# Patient Record
Sex: Female | Born: 1955
Health system: Southern US, Community
[De-identification: ages and names within clinical notes are randomized; demographics above are authoritative.]

## PROBLEM LIST (undated history)

## (undated) DIAGNOSIS — G4733 Obstructive sleep apnea (adult) (pediatric): Secondary | ICD-10-CM

## (undated) DIAGNOSIS — E785 Hyperlipidemia, unspecified: Secondary | ICD-10-CM

## (undated) DIAGNOSIS — K743 Primary biliary cirrhosis: Secondary | ICD-10-CM

## (undated) DIAGNOSIS — I10 Essential (primary) hypertension: Secondary | ICD-10-CM

## (undated) DIAGNOSIS — E119 Type 2 diabetes mellitus without complications: Secondary | ICD-10-CM

---

## 2019-06-20 ENCOUNTER — Encounter (HOSPITAL_COMMUNITY): Payer: Self-pay

## 2019-06-20 ENCOUNTER — Emergency Department (HOSPITAL_COMMUNITY): Payer: Federal, State, Local not specified - PPO

## 2019-06-20 ENCOUNTER — Other Ambulatory Visit: Payer: Self-pay

## 2019-06-20 ENCOUNTER — Inpatient Hospital Stay (HOSPITAL_COMMUNITY)
Admission: EM | Admit: 2019-06-20 | Discharge: 2019-06-23 | DRG: 511 | Disposition: A | Discharge status: Home Health | Payer: Federal, State, Local not specified - PPO | Attending: Surgery | Admitting: Surgery

## 2019-06-20 DIAGNOSIS — Z20828 Contact with and (suspected) exposure to other viral communicable diseases: Secondary | ICD-10-CM | POA: Diagnosis present

## 2019-06-20 DIAGNOSIS — S2243XA Multiple fractures of ribs, bilateral, initial encounter for closed fracture: Secondary | ICD-10-CM | POA: Diagnosis present

## 2019-06-20 DIAGNOSIS — I10 Essential (primary) hypertension: Secondary | ICD-10-CM | POA: Diagnosis present

## 2019-06-20 DIAGNOSIS — S62109A Fracture of unspecified carpal bone, unspecified wrist, initial encounter for closed fracture: Secondary | ICD-10-CM

## 2019-06-20 DIAGNOSIS — Z7984 Long term (current) use of oral hypoglycemic drugs: Secondary | ICD-10-CM

## 2019-06-20 DIAGNOSIS — Z79899 Other long term (current) drug therapy: Secondary | ICD-10-CM

## 2019-06-20 DIAGNOSIS — S301XXA Contusion of abdominal wall, initial encounter: Secondary | ICD-10-CM | POA: Diagnosis present

## 2019-06-20 DIAGNOSIS — E785 Hyperlipidemia, unspecified: Secondary | ICD-10-CM | POA: Diagnosis present

## 2019-06-20 DIAGNOSIS — S63006A Unspecified dislocation of unspecified wrist and hand, initial encounter: Secondary | ICD-10-CM

## 2019-06-20 DIAGNOSIS — S52571A Other intraarticular fracture of lower end of right radius, initial encounter for closed fracture: Secondary | ICD-10-CM | POA: Diagnosis not present

## 2019-06-20 DIAGNOSIS — D62 Acute posthemorrhagic anemia: Secondary | ICD-10-CM | POA: Diagnosis not present

## 2019-06-20 DIAGNOSIS — S2249XA Multiple fractures of ribs, unspecified side, initial encounter for closed fracture: Secondary | ICD-10-CM

## 2019-06-20 DIAGNOSIS — G4733 Obstructive sleep apnea (adult) (pediatric): Secondary | ICD-10-CM | POA: Diagnosis present

## 2019-06-20 DIAGNOSIS — Z7982 Long term (current) use of aspirin: Secondary | ICD-10-CM

## 2019-06-20 DIAGNOSIS — Y92410 Unspecified street and highway as the place of occurrence of the external cause: Secondary | ICD-10-CM

## 2019-06-20 DIAGNOSIS — S52531A Colles' fracture of right radius, initial encounter for closed fracture: Secondary | ICD-10-CM

## 2019-06-20 DIAGNOSIS — S2241XA Multiple fractures of ribs, right side, initial encounter for closed fracture: Secondary | ICD-10-CM

## 2019-06-20 DIAGNOSIS — M25531 Pain in right wrist: Secondary | ICD-10-CM | POA: Diagnosis not present

## 2019-06-20 HISTORY — DX: Type 2 diabetes mellitus without complications: E11.9

## 2019-06-20 HISTORY — DX: Hyperlipidemia, unspecified: E78.5

## 2019-06-20 HISTORY — DX: Obstructive sleep apnea (adult) (pediatric): G47.33

## 2019-06-20 HISTORY — DX: Primary biliary cirrhosis: K74.3

## 2019-06-20 HISTORY — DX: Essential (primary) hypertension: I10

## 2019-06-20 LAB — I-STAT CHEM 8, ED
BUN: 20 mg/dL (ref 8–23)
Calcium, Ion: 1.07 mmol/L — ABNORMAL LOW (ref 1.15–1.40)
Chloride: 107 mmol/L (ref 98–111)
Creatinine, Ser: 0.6 mg/dL (ref 0.44–1.00)
Glucose, Bld: 246 mg/dL — ABNORMAL HIGH (ref 70–99)
HCT: 35 % — ABNORMAL LOW (ref 36.0–46.0)
Hemoglobin: 11.9 g/dL — ABNORMAL LOW (ref 12.0–15.0)
Potassium: 4.1 mmol/L (ref 3.5–5.1)
Sodium: 138 mmol/L (ref 135–145)
TCO2: 22 mmol/L (ref 22–32)

## 2019-06-20 LAB — CBC WITH DIFFERENTIAL/PLATELET
Abs Immature Granulocytes: 0.12 10*3/uL — ABNORMAL HIGH (ref 0.00–0.07)
Basophils Absolute: 0.1 10*3/uL (ref 0.0–0.1)
Basophils Relative: 0 %
Eosinophils Absolute: 0.1 10*3/uL (ref 0.0–0.5)
Eosinophils Relative: 0 %
HCT: 36.8 % (ref 36.0–46.0)
Hemoglobin: 11.9 g/dL — ABNORMAL LOW (ref 12.0–15.0)
Immature Granulocytes: 1 %
Lymphocytes Relative: 8 %
Lymphs Abs: 1.3 10*3/uL (ref 0.7–4.0)
MCH: 29.9 pg (ref 26.0–34.0)
MCHC: 32.3 g/dL (ref 30.0–36.0)
MCV: 92.5 fL (ref 80.0–100.0)
Monocytes Absolute: 1 10*3/uL (ref 0.1–1.0)
Monocytes Relative: 6 %
Neutro Abs: 13.6 10*3/uL — ABNORMAL HIGH (ref 1.7–7.7)
Neutrophils Relative %: 85 %
Platelets: 290 10*3/uL (ref 150–400)
RBC: 3.98 MIL/uL (ref 3.87–5.11)
RDW: 13.3 % (ref 11.5–15.5)
WBC: 16.2 10*3/uL — ABNORMAL HIGH (ref 4.0–10.5)
nRBC: 0 % (ref 0.0–0.2)

## 2019-06-20 LAB — BASIC METABOLIC PANEL
Anion gap: 12 (ref 5–15)
BUN: 18 mg/dL (ref 8–23)
CO2: 19 mmol/L — ABNORMAL LOW (ref 22–32)
Calcium: 9.2 mg/dL (ref 8.9–10.3)
Chloride: 106 mmol/L (ref 98–111)
Creatinine, Ser: 0.73 mg/dL (ref 0.44–1.00)
GFR calc Af Amer: >60 mL/min (ref >60)
GFR calc non Af Amer: >60 mL/min (ref >60)
Glucose, Bld: 241 mg/dL — ABNORMAL HIGH (ref 70–99)
Potassium: 4.1 mmol/L (ref 3.5–5.1)
Sodium: 137 mmol/L (ref 135–145)

## 2019-06-20 MED ORDER — MORPHINE SULFATE (PF) 4 MG/ML IV SOLN
4.0000 mg | Freq: Once | INTRAVENOUS | Status: AC
  Administered 2019-06-20: 4 mg via INTRAVENOUS
  Filled 2019-06-20: qty 1

## 2019-06-20 MED ORDER — IOHEXOL 300 MG/ML  SOLN
100.0000 mL | Freq: Once | INTRAMUSCULAR | Status: AC | PRN
  Administered 2019-06-20: 18:00:00 100 mL via INTRAVENOUS

## 2019-06-20 MED ORDER — HYDROMORPHONE HCL 1 MG/ML IJ SOLN
1.0000 mg | Freq: Once | INTRAMUSCULAR | Status: AC
  Administered 2019-06-20: 19:00:00 1 mg via INTRAVENOUS
  Filled 2019-06-20: qty 1

## 2019-06-20 MED ORDER — LIDOCAINE HCL 2 % IJ SOLN
20.0000 mL | Freq: Once | INTRAMUSCULAR | Status: AC
  Administered 2019-06-21: 400 mg
  Filled 2019-06-20: qty 20

## 2019-06-20 MED ORDER — HYDROMORPHONE HCL 1 MG/ML IJ SOLN
1.0000 mg | Freq: Once | INTRAMUSCULAR | Status: AC
  Administered 2019-06-20: 21:00:00 1 mg via INTRAVENOUS
  Filled 2019-06-20: qty 1
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
## 2019-06-21 ENCOUNTER — Encounter (HOSPITAL_COMMUNITY): Admission: EM | Disposition: A | Discharge status: Home Health | Payer: Self-pay | Source: Home / Self Care

## 2019-06-21 ENCOUNTER — Inpatient Hospital Stay (HOSPITAL_COMMUNITY): Payer: Federal, State, Local not specified - PPO | Admitting: Anesthesiology

## 2019-06-21 ENCOUNTER — Inpatient Hospital Stay (HOSPITAL_COMMUNITY): Payer: Federal, State, Local not specified - PPO

## 2019-06-21 DIAGNOSIS — D62 Acute posthemorrhagic anemia: Secondary | ICD-10-CM | POA: Diagnosis not present

## 2019-06-21 DIAGNOSIS — Z79899 Other long term (current) drug therapy: Secondary | ICD-10-CM | POA: Diagnosis not present

## 2019-06-21 DIAGNOSIS — Z7982 Long term (current) use of aspirin: Secondary | ICD-10-CM | POA: Diagnosis not present

## 2019-06-21 DIAGNOSIS — E785 Hyperlipidemia, unspecified: Secondary | ICD-10-CM | POA: Diagnosis present

## 2019-06-21 DIAGNOSIS — S2243XA Multiple fractures of ribs, bilateral, initial encounter for closed fracture: Secondary | ICD-10-CM | POA: Diagnosis present

## 2019-06-21 DIAGNOSIS — S2249XA Multiple fractures of ribs, unspecified side, initial encounter for closed fracture: Secondary | ICD-10-CM | POA: Diagnosis present

## 2019-06-21 DIAGNOSIS — S52571A Other intraarticular fracture of lower end of right radius, initial encounter for closed fracture: Secondary | ICD-10-CM

## 2019-06-21 DIAGNOSIS — S301XXA Contusion of abdominal wall, initial encounter: Secondary | ICD-10-CM | POA: Diagnosis present

## 2019-06-21 DIAGNOSIS — Z7984 Long term (current) use of oral hypoglycemic drugs: Secondary | ICD-10-CM | POA: Diagnosis not present

## 2019-06-21 DIAGNOSIS — M25531 Pain in right wrist: Secondary | ICD-10-CM

## 2019-06-21 DIAGNOSIS — G4733 Obstructive sleep apnea (adult) (pediatric): Secondary | ICD-10-CM | POA: Diagnosis present

## 2019-06-21 DIAGNOSIS — Z20828 Contact with and (suspected) exposure to other viral communicable diseases: Secondary | ICD-10-CM | POA: Diagnosis present

## 2019-06-21 DIAGNOSIS — Y92410 Unspecified street and highway as the place of occurrence of the external cause: Secondary | ICD-10-CM | POA: Diagnosis not present

## 2019-06-21 DIAGNOSIS — I10 Essential (primary) hypertension: Secondary | ICD-10-CM | POA: Diagnosis present

## 2019-06-21 HISTORY — PX: ORIF WRIST FRACTURE: SHX2133

## 2019-06-21 LAB — BASIC METABOLIC PANEL
Anion gap: 11 (ref 5–15)
BUN: 16 mg/dL (ref 8–23)
CO2: 24 mmol/L (ref 22–32)
Calcium: 8.9 mg/dL (ref 8.9–10.3)
Chloride: 104 mmol/L (ref 98–111)
Creatinine, Ser: 0.73 mg/dL (ref 0.44–1.00)
GFR calc Af Amer: >60 mL/min (ref >60)
GFR calc non Af Amer: >60 mL/min (ref >60)
Glucose, Bld: 222 mg/dL — ABNORMAL HIGH (ref 70–99)
Potassium: 4.2 mmol/L (ref 3.5–5.1)
Sodium: 139 mmol/L (ref 135–145)

## 2019-06-21 LAB — HIV ANTIBODY (ROUTINE TESTING W REFLEX): HIV Screen 4th Generation wRfx: NONREACTIVE

## 2019-06-21 LAB — HEMOGLOBIN A1C
Hgb A1c MFr Bld: 9 % — ABNORMAL HIGH (ref 4.8–5.6)
Mean Plasma Glucose: 211.6 mg/dL

## 2019-06-21 LAB — CBC
HCT: 32.7 % — ABNORMAL LOW (ref 36.0–46.0)
Hemoglobin: 10.9 g/dL — ABNORMAL LOW (ref 12.0–15.0)
MCH: 30.3 pg (ref 26.0–34.0)
MCHC: 33.3 g/dL (ref 30.0–36.0)
MCV: 90.8 fL (ref 80.0–100.0)
Platelets: 301 10*3/uL (ref 150–400)
RBC: 3.6 MIL/uL — ABNORMAL LOW (ref 3.87–5.11)
RDW: 13.5 % (ref 11.5–15.5)
WBC: 10.5 10*3/uL (ref 4.0–10.5)
nRBC: 0 % (ref 0.0–0.2)

## 2019-06-21 LAB — GLUCOSE, CAPILLARY
Glucose-Capillary: 195 mg/dL — ABNORMAL HIGH (ref 70–99)
Glucose-Capillary: 218 mg/dL — ABNORMAL HIGH (ref 70–99)
Glucose-Capillary: 158 mg/dL — ABNORMAL HIGH (ref 70–99)
Glucose-Capillary: 173 mg/dL — ABNORMAL HIGH (ref 70–99)
Glucose-Capillary: 213 mg/dL — ABNORMAL HIGH (ref 70–99)

## 2019-06-21 LAB — MRSA PCR SCREENING: MRSA by PCR: NEGATIVE

## 2019-06-21 LAB — SARS CORONAVIRUS 2 (TAT 6-24 HRS): SARS Coronavirus 2: NEGATIVE

## 2019-06-21 SURGERY — OPEN REDUCTION INTERNAL FIXATION (ORIF) WRIST FRACTURE
Anesthesia: Monitor Anesthesia Care | Site: Wrist | Laterality: Right

## 2019-06-21 MED ORDER — MEPERIDINE HCL 25 MG/ML IJ SOLN: 6.2500 mg | INTRAMUSCULAR | Status: DC | PRN

## 2019-06-21 MED ORDER — FENTANYL CITRATE (PF) 100 MCG/2ML IJ SOLN: 50.0000 ug | Freq: Once | INTRAMUSCULAR | Status: DC

## 2019-06-21 MED ORDER — ACETAMINOPHEN 325 MG PO TABS
650.0000 mg | ORAL_TABLET | ORAL | Status: DC | PRN
  Administered 2019-06-21: 05:00:00 650 mg via ORAL
  Filled 2019-06-21: qty 2

## 2019-06-21 MED ORDER — NITROGLYCERIN 0.4 MG SL SUBL: 0.4000 mg | SUBLINGUAL_TABLET | SUBLINGUAL | Status: DC | PRN

## 2019-06-21 MED ORDER — PROPOFOL 1000 MG/100ML IV EMUL
INTRAVENOUS | Status: AC
  Filled 2019-06-21: qty 200

## 2019-06-21 MED ORDER — OXYCODONE HCL 5 MG PO TABS
5.0000 mg | ORAL_TABLET | ORAL | Status: DC | PRN
  Filled 2019-06-21: qty 1

## 2019-06-21 MED ORDER — LACTATED RINGERS IV SOLN
INTRAVENOUS | Status: DC | PRN
  Administered 2019-06-21 (×2): via INTRAVENOUS

## 2019-06-21 MED ORDER — BUPIVACAINE-EPINEPHRINE (PF) 0.5% -1:200000 IJ SOLN
INTRAMUSCULAR | Status: DC | PRN
  Administered 2019-06-21: 30 mL via PERINEURAL

## 2019-06-21 MED ORDER — ONDANSETRON HCL 4 MG/2ML IJ SOLN
INTRAMUSCULAR | Status: AC
  Filled 2019-06-21: qty 2

## 2019-06-21 MED ORDER — LIDOCAINE-EPINEPHRINE 0.5 %-1:200000 IJ SOLN
INTRAMUSCULAR | Status: AC
  Filled 2019-06-21: qty 1

## 2019-06-21 MED ORDER — METFORMIN HCL 500 MG PO TABS
1000.0000 mg | ORAL_TABLET | Freq: Two times a day (BID) | ORAL | Status: DC
  Administered 2019-06-22 – 2019-06-23 (×3): 1000 mg via ORAL
  Filled 2019-06-21 (×3): qty 2

## 2019-06-21 MED ORDER — FENTANYL CITRATE (PF) 100 MCG/2ML IJ SOLN
INTRAMUSCULAR | Status: AC
  Filled 2019-06-21: qty 2

## 2019-06-21 MED ORDER — POTASSIUM CHLORIDE IN NACL 20-0.9 MEQ/L-% IV SOLN
INTRAVENOUS | Status: DC
  Administered 2019-06-21: 02:00:00 via INTRAVENOUS
  Filled 2019-06-21: qty 1000

## 2019-06-21 MED ORDER — 0.9 % SODIUM CHLORIDE (POUR BTL) OPTIME
TOPICAL | Status: DC | PRN
  Administered 2019-06-21: 1000 mL

## 2019-06-21 MED ORDER — ONDANSETRON HCL 4 MG/2ML IJ SOLN
4.0000 mg | Freq: Four times a day (QID) | INTRAMUSCULAR | Status: DC | PRN
  Administered 2019-06-21 – 2019-06-22 (×2): 4 mg via INTRAVENOUS
  Filled 2019-06-21 (×2): qty 2

## 2019-06-21 MED ORDER — METOPROLOL TARTRATE 5 MG/5ML IV SOLN: 5.0000 mg | Freq: Four times a day (QID) | INTRAVENOUS | Status: DC | PRN

## 2019-06-21 MED ORDER — ONDANSETRON HCL 4 MG/2ML IJ SOLN
4.0000 mg | Freq: Once | INTRAMUSCULAR | Status: AC
  Administered 2019-06-21: 01:00:00 4 mg via INTRAVENOUS

## 2019-06-21 MED ORDER — LISINOPRIL 5 MG PO TABS
2.5000 mg | ORAL_TABLET | Freq: Every day | ORAL | Status: DC
  Administered 2019-06-22 – 2019-06-23 (×2): 2.5 mg via ORAL
  Filled 2019-06-21 (×2): qty 1

## 2019-06-21 MED ORDER — PROPOFOL 10 MG/ML IV BOLUS
INTRAVENOUS | Status: AC
  Filled 2019-06-21: qty 20

## 2019-06-21 MED ORDER — MIDAZOLAM HCL 2 MG/2ML IJ SOLN
INTRAMUSCULAR | Status: AC
  Filled 2019-06-21: qty 2

## 2019-06-21 MED ORDER — ENOXAPARIN SODIUM 40 MG/0.4ML ~~LOC~~ SOLN
40.0000 mg | SUBCUTANEOUS | Status: DC
  Administered 2019-06-22 – 2019-06-23 (×2): 40 mg via SUBCUTANEOUS
  Filled 2019-06-21 (×2): qty 0.4

## 2019-06-21 MED ORDER — HYDROMORPHONE HCL 1 MG/ML IJ SOLN
1.0000 mg | INTRAMUSCULAR | Status: DC | PRN
  Administered 2019-06-21 – 2019-06-22 (×3): 1 mg via INTRAVENOUS
  Filled 2019-06-21 (×3): qty 1

## 2019-06-21 MED ORDER — PROPOFOL 500 MG/50ML IV EMUL
INTRAVENOUS | Status: DC | PRN
  Administered 2019-06-21: 50 ug/kg/min via INTRAVENOUS

## 2019-06-21 MED ORDER — LIDOCAINE-EPINEPHRINE 0.5 %-1:200000 IJ SOLN
INTRAMUSCULAR | Status: DC | PRN
  Administered 2019-06-21: 20 mL

## 2019-06-21 MED ORDER — FENTANYL CITRATE (PF) 250 MCG/5ML IJ SOLN
INTRAMUSCULAR | Status: AC
  Filled 2019-06-21: qty 5

## 2019-06-21 MED ORDER — OXYCODONE HCL 5 MG PO TABS
10.0000 mg | ORAL_TABLET | ORAL | Status: DC | PRN
  Administered 2019-06-21 – 2019-06-22 (×5): 10 mg via ORAL
  Filled 2019-06-21 (×4): qty 2

## 2019-06-21 MED ORDER — BUPIVACAINE HCL (PF) 0.25 % IJ SOLN
INTRAMUSCULAR | Status: AC
  Filled 2019-06-21: qty 30

## 2019-06-21 MED ORDER — PHENYLEPHRINE 40 MCG/ML (10ML) SYRINGE FOR IV PUSH (FOR BLOOD PRESSURE SUPPORT)
PREFILLED_SYRINGE | INTRAVENOUS | Status: AC
  Filled 2019-06-21: qty 10

## 2019-06-21 MED ORDER — METHOCARBAMOL 500 MG PO TABS
ORAL_TABLET | ORAL | Status: AC
  Filled 2019-06-21: qty 2

## 2019-06-21 MED ORDER — INSULIN ASPART 100 UNIT/ML ~~LOC~~ SOLN
0.0000 [IU] | Freq: Three times a day (TID) | SUBCUTANEOUS | Status: DC
  Administered 2019-06-21 (×2): 3 [IU] via SUBCUTANEOUS
  Administered 2019-06-21: 5 [IU] via SUBCUTANEOUS
  Administered 2019-06-22: 8 [IU] via SUBCUTANEOUS
  Administered 2019-06-22: 17:00:00 3 [IU] via SUBCUTANEOUS
  Administered 2019-06-22: 5 [IU] via SUBCUTANEOUS
  Administered 2019-06-23 (×2): 3 [IU] via SUBCUTANEOUS

## 2019-06-21 MED ORDER — ONDANSETRON HCL 4 MG/2ML IJ SOLN: 4.0000 mg | Freq: Once | INTRAMUSCULAR | Status: DC

## 2019-06-21 MED ORDER — ONDANSETRON 4 MG PO TBDP: 4.0000 mg | ORAL_TABLET | Freq: Four times a day (QID) | ORAL | Status: DC | PRN

## 2019-06-21 MED ORDER — DEXTROSE 5 % IV SOLN
3.0000 g | INTRAVENOUS | Status: AC
  Administered 2019-06-21: 3 g via INTRAVENOUS
  Filled 2019-06-21: qty 3000

## 2019-06-21 MED ORDER — ONDANSETRON HCL 4 MG/2ML IJ SOLN
INTRAMUSCULAR | Status: DC | PRN
  Administered 2019-06-21: 4 mg via INTRAVENOUS

## 2019-06-21 MED ORDER — METHOCARBAMOL 500 MG PO TABS
750.0000 mg | ORAL_TABLET | Freq: Three times a day (TID) | ORAL | Status: DC
  Administered 2019-06-21 – 2019-06-23 (×9): 750 mg via ORAL
  Filled 2019-06-21 (×8): qty 2

## 2019-06-21 MED ORDER — PROPOFOL 10 MG/ML IV BOLUS
125.0000 mg | Freq: Once | INTRAVENOUS | Status: AC
  Administered 2019-06-21: 02:00:00 60 mg via INTRAVENOUS
  Filled 2019-06-21: qty 20

## 2019-06-21 MED ORDER — KETOROLAC TROMETHAMINE 15 MG/ML IJ SOLN
15.0000 mg | Freq: Four times a day (QID) | INTRAMUSCULAR | Status: DC
  Administered 2019-06-21 – 2019-06-23 (×7): 15 mg via INTRAVENOUS
  Filled 2019-06-21 (×7): qty 1

## 2019-06-21 MED ORDER — LACTATED RINGERS IV SOLN
INTRAVENOUS | Status: DC
  Administered 2019-06-21 – 2019-06-22 (×2): via INTRAVENOUS

## 2019-06-21 MED ORDER — OXYCODONE HCL 5 MG PO TABS
ORAL_TABLET | ORAL | Status: AC
  Filled 2019-06-21: qty 2

## 2019-06-21 MED ORDER — ONDANSETRON HCL 4 MG/2ML IJ SOLN: 4.0000 mg | Freq: Once | INTRAMUSCULAR | Status: DC | PRN

## 2019-06-21 MED ORDER — HYDROMORPHONE HCL 1 MG/ML IJ SOLN
0.5000 mg | Freq: Once | INTRAMUSCULAR | Status: AC
  Administered 2019-06-21: 0.5 mg via INTRAVENOUS
  Filled 2019-06-21: qty 1

## 2019-06-21 MED ORDER — ESCITALOPRAM OXALATE 10 MG PO TABS
20.0000 mg | ORAL_TABLET | Freq: Every day | ORAL | Status: DC
  Administered 2019-06-21 – 2019-06-22 (×2): 20 mg via ORAL
  Filled 2019-06-21 (×2): qty 2

## 2019-06-21 MED ORDER — SIMVASTATIN 20 MG PO TABS
40.0000 mg | ORAL_TABLET | Freq: Every day | ORAL | Status: DC
  Administered 2019-06-22: 40 mg via ORAL
  Filled 2019-06-21: qty 2

## 2019-06-21 MED ORDER — FENTANYL CITRATE (PF) 100 MCG/2ML IJ SOLN
50.0000 ug | Freq: Once | INTRAMUSCULAR | Status: AC
  Administered 2019-06-21: 01:00:00 50 ug via INTRAVENOUS
  Filled 2019-06-21: qty 2

## 2019-06-21 MED ORDER — HYDROMORPHONE HCL 1 MG/ML IJ SOLN: 0.2500 mg | INTRAMUSCULAR | Status: DC | PRN

## 2019-06-21 MED ORDER — LINAGLIPTIN 5 MG PO TABS
5.0000 mg | ORAL_TABLET | Freq: Every day | ORAL | Status: DC
  Administered 2019-06-22 – 2019-06-23 (×2): 5 mg via ORAL
  Filled 2019-06-21 (×2): qty 1

## 2019-06-21 MED ORDER — GLIMEPIRIDE 2 MG PO TABS
2.0000 mg | ORAL_TABLET | Freq: Every day | ORAL | Status: DC
  Administered 2019-06-22 – 2019-06-23 (×2): 2 mg via ORAL
  Filled 2019-06-21 (×3): qty 1

## 2019-06-21 MED ORDER — PROPOFOL 10 MG/ML IV BOLUS
INTRAVENOUS | Status: DC | PRN
  Administered 2019-06-21: 10 mg via INTRAVENOUS

## 2019-06-21 MED ORDER — PROPOFOL 10 MG/ML IV BOLUS
INTRAVENOUS | Status: AC | PRN
  Administered 2019-06-21: 20 mg via INTRAVENOUS

## 2019-06-21 SURGICAL SUPPLY — 46 items
APL SKNCLS STERI-STRIP NONHPOA (GAUZE/BANDAGES/DRESSINGS) ×1
BENZOIN TINCTURE PRP APPL 2/3 (GAUZE/BANDAGES/DRESSINGS) ×1 IMPLANT
BIT DRILL 2.2 SS TIBIAL (BIT) ×1 IMPLANT
BNDG CMPR 9X4 STRL LF SNTH (GAUZE/BANDAGES/DRESSINGS) ×1
BNDG ELASTIC 3X5.8 VLCR STR LF (GAUZE/BANDAGES/DRESSINGS) ×2 IMPLANT
BNDG ELASTIC 4X5.8 VLCR STR LF (GAUZE/BANDAGES/DRESSINGS) ×2 IMPLANT
BNDG ESMARK 4X9 LF (GAUZE/BANDAGES/DRESSINGS) ×2 IMPLANT
BNDG GAUZE ELAST 4 BULKY (GAUZE/BANDAGES/DRESSINGS) ×1 IMPLANT
CANISTER SUCT 3000ML PPV (MISCELLANEOUS) ×2 IMPLANT
CORD BIPOLAR FORCEPS 12FT (ELECTRODE) ×2 IMPLANT
COVER SURGICAL LIGHT HANDLE (MISCELLANEOUS) ×2 IMPLANT
CUFF TOURN SGL QUICK 24 (TOURNIQUET CUFF) ×2
CUFF TRNQT CYL 24X4X16.5-23 (TOURNIQUET CUFF) IMPLANT
DRAPE OEC MINIVIEW 54X84 (DRAPES) ×2 IMPLANT
DRAPE SURG 17X23 STRL (DRAPES) ×2 IMPLANT
GAUZE SPONGE 4X4 12PLY STRL (GAUZE/BANDAGES/DRESSINGS) ×2 IMPLANT
GLOVE SS BIOGEL STRL SZ 8 (GLOVE) ×1 IMPLANT
GLOVE SUPERSENSE BIOGEL SZ 8 (GLOVE) ×1
GOWN STRL REUS W/ TWL LRG LVL3 (GOWN DISPOSABLE) ×1 IMPLANT
GOWN STRL REUS W/ TWL XL LVL3 (GOWN DISPOSABLE) ×1 IMPLANT
GOWN STRL REUS W/TWL LRG LVL3 (GOWN DISPOSABLE) ×2
GOWN STRL REUS W/TWL XL LVL3 (GOWN DISPOSABLE) ×2
K-WIRE 1.6 (WIRE) ×4
K-WIRE FX5X1.6XNS BN SS (WIRE) ×2
KIT BASIN OR (CUSTOM PROCEDURE TRAY) ×2 IMPLANT
KIT TURNOVER KIT B (KITS) ×2 IMPLANT
KWIRE FX5X1.6XNS BN SS (WIRE) IMPLANT
NEEDLE 22X1 1/2 (OR ONLY) (NEEDLE) ×1 IMPLANT
NS IRRIG 1000ML POUR BTL (IV SOLUTION) ×2 IMPLANT
PACK ORTHO EXTREMITY (CUSTOM PROCEDURE TRAY) ×2 IMPLANT
PAD ARMBOARD 7.5X6 YLW CONV (MISCELLANEOUS) ×4 IMPLANT
PEG LOCKING SMOOTH 2.2X18 (Peg) ×4 IMPLANT
PEG LOCKING SMOOTH 2.2X20 (Screw) ×3 IMPLANT
PLATE DVR CROSSLOCK STD RT (Plate) ×1 IMPLANT
SCREW LOCK 14X2.7X 3 LD TPR (Screw) IMPLANT
SCREW LOCKING 2.7X13MM (Screw) ×1 IMPLANT
SCREW LOCKING 2.7X14 (Screw) ×4 IMPLANT
STRIP CLOSURE SKIN 1/2X4 (GAUZE/BANDAGES/DRESSINGS) ×1 IMPLANT
SUT MNCRL AB 4-0 PS2 18 (SUTURE) ×2 IMPLANT
SYR CONTROL 10ML LL (SYRINGE) ×1 IMPLANT
TOWEL GREEN STERILE (TOWEL DISPOSABLE) ×2 IMPLANT
TOWEL GREEN STERILE FF (TOWEL DISPOSABLE) ×2 IMPLANT
TRAY FOLEY MTR SLVR 14FR STAT (SET/KITS/TRAYS/PACK) ×1 IMPLANT
TUBE CONNECTING 12X1/4 (SUCTIONS) ×2 IMPLANT
UNDERPAD 30X30 (UNDERPADS AND DIAPERS) ×2 IMPLANT
WATER STERILE IRR 1000ML POUR (IV SOLUTION) ×2 IMPLANT

## 2019-06-21 NOTE — Anesthesia Postprocedure Evaluation (Signed)
Anesthesia Post Note  Patient: Priscilla Mcfarland  Procedure(s) Performed: OPEN REDUCTION INTERNAL FIXATION DISTAL RADIUS FRACTURE (Right Wrist)     Patient location during evaluation: PACU Anesthesia Type: Regional Level of consciousness: awake and alert and patient cooperative Pain management: pain level controlled Vital Signs Assessment: post-procedure vital signs reviewed and stable Respiratory status: spontaneous breathing and respiratory function stable Cardiovascular status: stable Anesthetic complications: no    Last Vitals:  Vitals:   06/21/19 1610 06/21/19 1625  BP: (!) 142/62 140/66  Pulse: 77 77  Resp: 18 17  Temp:  36.8 C  SpO2: 96% 95%    Last Pain:  Vitals:   06/21/19 1654  TempSrc:   PainSc: 0-No pain                 Heather Streeper DAVID

## 2019-06-21 NOTE — Progress Notes (Signed)
PT Cancellation Note  Patient Details Name: Priscilla Mcfarland MRN: [id number] DOB: 05/24/1956   Cancelled Treatment:    Reason Eval/Treat Not Completed: Patient not medically ready. Planning to go to the OR today. PT will continue to f/u with pt acutely as available and appropriate.    Davis 06/21/2019, 12:30 PM

## 2019-06-21 NOTE — Progress Notes (Signed)
Orthopedic Tech Progress Note Patient Details:  Priscilla Mcfarland 11-25-1955 [id number]  Ortho Devices Type of Ortho Device: Sugartong splint, Finger trap Finger Trap Weight: 5lb Ortho Device/Splint Location: rue Ortho Device/Splint Interventions: Ordered, Application, Adjustment  applied 5 lbs figer traps for 25 min. Applied splint post reduction after finger traps. Post Interventions Patient Tolerated: Well Instructions Provided: Care of device, Adjustment of device   Karolee Stamps 06/21/2019, 2:06 AM

## 2019-06-21 NOTE — Anesthesia Procedure Notes (Signed)
Anesthesia Regional Block: Supraclavicular block   Pre-Anesthetic Checklist: ,, timeout performed, Correct Patient, Correct Site, Correct Laterality, Correct Procedure, Correct Position, site marked, Risks and benefits discussed,  Surgical consent,  Pre-op evaluation,  At surgeon's request and post-op pain management  Laterality: Right  Prep: chloraprep       Needles:   Needle Type: Echogenic Stimulator Needle     Needle Length: 9cm  Needle Gauge: 21     Additional Needles:   Procedures:, nerve stimulator,,,,,,,   Nerve Stimulator or Paresthesia:  Response: 0.4 mA,   Additional Responses:   Narrative:  Start time: 06/21/2019 1:10 PM End time: 06/21/2019 1:20 PM Injection made incrementally with aspirations every 5 mL.  Performed by: Personally  Anesthesiologist: Lillia Abed, MD  Additional Notes: Monitors applied. Patient sedated. Sterile prep and drape,hand hygiene and sterile gloves were used. Relevant anatomy identified.Needle position confirmed.Local anesthetic injected incrementally after negative aspiration. Local anesthetic spread visualized around nerve(s). Vascular puncture avoided. No complications. Image printed for medical record.The patient tolerated the procedure well.

## 2019-06-21 NOTE — Progress Notes (Signed)
Open reduction internal fixation of the right distal radius fracture performed today.  She tolerated the procedure well under regional anesthesia.  Patient can be discharged from my standpoint.  She will need follow-up with [facility] hand therapy for a removable volar wrist splint followed by follow-up with me in the office.  Please call me with any questions.
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
## 2019-06-21 NOTE — Transfer of Care (Signed)
Immediate Anesthesia Transfer of Care Note  Patient: BURMA KETCHER  Procedure(s) Performed: OPEN REDUCTION INTERNAL FIXATION DISTAL RADIUS FRACTURE (Right Wrist)  Patient Location: PACU  Anesthesia Type:MAC combined with regional for post-op pain  Level of Consciousness: awake, alert  and oriented  Airway & Oxygen Therapy: Patient Spontanous Breathing and Patient connected to nasal cannula oxygen  Post-op Assessment: Report given to RN and Post -op Vital signs reviewed and stable  Post vital signs: Reviewed and stable  Last Vitals:  Vitals Value Taken Time  BP 145/65 06/21/19 1525  Temp 36.8 C 06/21/19 1524  Pulse 84 06/21/19 1529  Resp 19 06/21/19 1529  SpO2 94 % 06/21/19 1529  Vitals shown include unvalidated device data.  Last Pain:  Vitals:   06/21/19 1524  TempSrc:   PainSc: 0-No pain      Patients Stated Pain Goal: 3 (34/28/76 8115)  Complications: No apparent anesthesia complications

## 2019-06-21 NOTE — Anesthesia Procedure Notes (Signed)
Procedure Name: MAC Date/Time: 06/21/2019 1:25 PM Performed by: Inda Coke, CRNA Pre-anesthesia Checklist: Patient identified, Emergency Drugs available, Suction available, Timeout performed and Patient being monitored Patient Re-evaluated:Patient Re-evaluated prior to induction Oxygen Delivery Method: Simple face mask Induction Type: IV induction Dental Injury: Teeth and Oropharynx as per pre-operative assessment
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
## 2019-06-21 NOTE — Interval H&P Note (Signed)
My history and physical from today was reviewed and I agree with the plan for open reduction internal fixation of distal radius fracture. Plan was discussed with the patient and she agrees. The risk and benefits were reviewed

## 2019-06-21 NOTE — Brief Op Note (Signed)
06/21/2019  3:19 PM  PATIENT:  Priscilla Mcfarland  63 y.o. female  PRE-OPERATIVE DIAGNOSIS:  Right Distal Radius Fracture  POST-OPERATIVE DIAGNOSIS:  Right Distal Radius Fracture  PROCEDURE:  Procedure(s): OPEN REDUCTION INTERNAL FIXATION DISTAL RADIUS FRACTURE (Right)  SURGEON:  Surgeon(s) and Role:    * Gregoire Bennis, Steffanie Dunn, MD - Primary  PHYSICIAN ASSISTANT: None   ASSISTANTS: none   ANESTHESIA:   regional  EBL:  10 mL   BLOOD ADMINISTERED:none  DRAINS: none   LOCAL MEDICATIONS USED:  LIDOCAINE   SPECIMEN:  No Specimen  DISPOSITION OF SPECIMEN:  N/A  COUNTS:  YES  TOURNIQUET:   Total Tourniquet Time Documented: Upper Arm (Right) - 59 minutes Total: Upper Arm (Right) - 59 minutes   DICTATION: .Viviann Spare Dictation  PLAN OF CARE: Admit to inpatient   PATIENT DISPOSITION:  PACU - hemodynamically stable.   Delay start of Pharmacological VTE agent (>24hrs) due to surgical blood loss or risk of bleeding: not applicable
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
## 2019-06-21 NOTE — Plan of Care (Signed)
  Problem: Education: Goal: Knowledge of General Education information will improve Description: Including pain rating scale, medication(s)/side effects and non-pharmacologic comfort measures Outcome: Progressing   Problem: Safety: Goal: Ability to remain free from injury will improve Outcome: Progressing   
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
## 2019-06-21 NOTE — Progress Notes (Signed)
OT Cancellation Note  Patient Details Name: Priscilla Mcfarland MRN: [id number] DOB: 03/05/1956   Cancelled Treatment:    Reason Eval/Treat Not Completed: Patient not medically ready;Other (comment). Plan is for surgery today pending Covid test results  Britt Bottom 06/21/2019, 10:23 AM

## 2019-06-21 NOTE — Progress Notes (Signed)
Orthopedic Tech Progress Note Patient Details:  Priscilla Mcfarland 26-Jul-1955 [id number]  Ortho Devices Type of Ortho Device: Sugartong splint Finger Trap Weight: 5lb Ortho Device/Splint Location: rue Ortho Device/Splint Interventions: Ordered, Application, Adjustment  applied post 2nd reduction. Post Interventions Patient Tolerated: Well Instructions Provided: Care of device, Adjustment of device   Karolee Stamps 06/21/2019, 2:08 AM

## 2019-06-22 ENCOUNTER — Inpatient Hospital Stay (HOSPITAL_COMMUNITY): Payer: Federal, State, Local not specified - PPO

## 2019-06-22 LAB — BASIC METABOLIC PANEL
Anion gap: 11 (ref 5–15)
BUN: 13 mg/dL (ref 8–23)
CO2: 23 mmol/L (ref 22–32)
Calcium: 8.7 mg/dL — ABNORMAL LOW (ref 8.9–10.3)
Chloride: 104 mmol/L (ref 98–111)
Creatinine, Ser: 0.78 mg/dL (ref 0.44–1.00)
GFR calc Af Amer: >60 mL/min (ref >60)
GFR calc non Af Amer: >60 mL/min (ref >60)
Glucose, Bld: 207 mg/dL — ABNORMAL HIGH (ref 70–99)
Potassium: 4.7 mmol/L (ref 3.5–5.1)
Sodium: 138 mmol/L (ref 135–145)

## 2019-06-22 LAB — CBC
HCT: 32.4 % — ABNORMAL LOW (ref 36.0–46.0)
Hemoglobin: 10.5 g/dL — ABNORMAL LOW (ref 12.0–15.0)
MCH: 30.3 pg (ref 26.0–34.0)
MCHC: 32.4 g/dL (ref 30.0–36.0)
MCV: 93.6 fL (ref 80.0–100.0)
Platelets: 273 10*3/uL (ref 150–400)
RBC: 3.46 MIL/uL — ABNORMAL LOW (ref 3.87–5.11)
RDW: 13.6 % (ref 11.5–15.5)
WBC: 11.5 10*3/uL — ABNORMAL HIGH (ref 4.0–10.5)
nRBC: 0 % (ref 0.0–0.2)

## 2019-06-22 LAB — GLUCOSE, CAPILLARY
Glucose-Capillary: 234 mg/dL — ABNORMAL HIGH (ref 70–99)
Glucose-Capillary: 284 mg/dL — ABNORMAL HIGH (ref 70–99)
Glucose-Capillary: 174 mg/dL — ABNORMAL HIGH (ref 70–99)
Glucose-Capillary: 124 mg/dL — ABNORMAL HIGH (ref 70–99)

## 2019-06-22 MED ORDER — CHLORHEXIDINE GLUCONATE CLOTH 2 % EX PADS: 6.0000 | MEDICATED_PAD | Freq: Every day | CUTANEOUS | Status: DC
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
## 2019-06-22 NOTE — Plan of Care (Signed)
  Problem: Pain Managment: Goal: General experience of comfort will improve Outcome: Progressing   Problem: Safety: Goal: Ability to remain free from injury will improve Outcome: Progressing   Problem: Skin Integrity: Goal: Risk for impaired skin integrity will decrease Outcome: Progressing   
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
## 2019-06-22 NOTE — Plan of Care (Signed)
PNT IS REQUIRING ASSIST WITH ADLS AND MOBILITY CURRENTLY. PNT WILL NEED 24/7 ASSIST AT HOME CURRENTLY. IF PNT HUSBAND IS ABLE TO ASSIST THEN HHOT RECOMMENDED.

## 2019-06-23 ENCOUNTER — Encounter: Payer: Self-pay | Admitting: *Deleted

## 2019-06-23 LAB — CBC
HCT: 29.1 % — ABNORMAL LOW (ref 36.0–46.0)
Hemoglobin: 9.3 g/dL — ABNORMAL LOW (ref 12.0–15.0)
MCH: 29.9 pg (ref 26.0–34.0)
MCHC: 32 g/dL (ref 30.0–36.0)
MCV: 93.6 fL (ref 80.0–100.0)
Platelets: 244 10*3/uL (ref 150–400)
RBC: 3.11 MIL/uL — ABNORMAL LOW (ref 3.87–5.11)
RDW: 13.6 % (ref 11.5–15.5)
WBC: 11.1 10*3/uL — ABNORMAL HIGH (ref 4.0–10.5)
nRBC: 0 % (ref 0.0–0.2)

## 2019-06-23 LAB — BASIC METABOLIC PANEL
Anion gap: 11 (ref 5–15)
BUN: 24 mg/dL — ABNORMAL HIGH (ref 8–23)
CO2: 18 mmol/L — ABNORMAL LOW (ref 22–32)
Calcium: 8.2 mg/dL — ABNORMAL LOW (ref 8.9–10.3)
Chloride: 107 mmol/L (ref 98–111)
Creatinine, Ser: 1.12 mg/dL — ABNORMAL HIGH (ref 0.44–1.00)
GFR calc Af Amer: >60 mL/min (ref >60)
GFR calc non Af Amer: 52 mL/min — ABNORMAL LOW (ref >60)
Glucose, Bld: 175 mg/dL — ABNORMAL HIGH (ref 70–99)
Potassium: 4.5 mmol/L (ref 3.5–5.1)
Sodium: 136 mmol/L (ref 135–145)

## 2019-06-23 LAB — GLUCOSE, CAPILLARY
Glucose-Capillary: 157 mg/dL — ABNORMAL HIGH (ref 70–99)
Glucose-Capillary: 186 mg/dL — ABNORMAL HIGH (ref 70–99)
Glucose-Capillary: 139 mg/dL — ABNORMAL HIGH (ref 70–99)

## 2019-06-23 LAB — PHOSPHORUS: Phosphorus: 3.5 mg/dL (ref 2.5–4.6)

## 2019-06-23 LAB — MAGNESIUM: Magnesium: 2.2 mg/dL (ref 1.7–2.4)

## 2019-06-23 MED ORDER — ACETAMINOPHEN 325 MG PO TABS
650.0000 mg | ORAL_TABLET | Freq: Four times a day (QID) | ORAL | Status: DC
  Administered 2019-06-23: 09:00:00 650 mg via ORAL
  Filled 2019-06-23: qty 2

## 2019-06-23 MED ORDER — POLYETHYLENE GLYCOL 3350 17 G PO PACK
17.0000 g | PACK | Freq: Every day | ORAL | Status: DC
  Administered 2019-06-23: 09:00:00 17 g via ORAL
  Filled 2019-06-23: qty 1

## 2019-06-23 MED ORDER — DOCUSATE SODIUM 100 MG PO CAPS
100.0000 mg | ORAL_CAPSULE | Freq: Two times a day (BID) | ORAL | Status: DC
  Administered 2019-06-23: 09:00:00 100 mg via ORAL
  Filled 2019-06-23: qty 1

## 2019-06-23 MED ORDER — DOCUSATE SODIUM 100 MG PO CAPS: 100.0000 mg | ORAL_CAPSULE | Freq: Two times a day (BID) | ORAL | 0 refills | Status: AC

## 2019-06-23 MED ORDER — ACETAMINOPHEN 325 MG PO TABS: 650.0000 mg | ORAL_TABLET | Freq: Four times a day (QID) | ORAL | Status: DC | PRN

## 2019-06-23 MED ORDER — OXYCODONE HCL 5 MG PO TABS: 5.0000 mg | ORAL_TABLET | Freq: Four times a day (QID) | ORAL | 0 refills | Status: AC | PRN

## 2019-06-23 MED ORDER — OXYCODONE HCL 5 MG PO TABS
5.0000 mg | ORAL_TABLET | ORAL | Status: DC | PRN
  Administered 2019-06-23 (×2): 10 mg via ORAL
  Filled 2019-06-23 (×2): qty 2

## 2019-06-23 MED ORDER — METHOCARBAMOL 750 MG PO TABS: 750.0000 mg | ORAL_TABLET | Freq: Three times a day (TID) | ORAL | 0 refills | Status: AC | PRN

## 2019-06-23 MED FILL — METHOCARBAMOL 750 MG TABS: 750 | 20 days supply | Qty: 60 | Fill #0

## 2019-06-23 MED FILL — oxyCODONE HCL 5 MG TABS: 5 | 4 days supply | Qty: 15 | Fill #0

## 2019-06-23 NOTE — Discharge Instructions (Signed)
RIB FRACTURES  HOME INSTRUCTIONS   1. PAIN CONTROL:  1. Pain is best controlled by a usual combination of three different methods TOGETHER:  i. Ice/Heat ii. Over the counter pain medication iii. Prescription pain medication 2. You may experience some swelling and bruising in area of broken ribs. Ice packs or heating pads (30-60 minutes up to 6 times a day) will help. Use ice for the first few days to help decrease swelling and bruising, then switch to heat to help relax tight/sore spots and speed recovery. Some people prefer to use ice alone, heat alone, alternating between ice & heat. Experiment to what works for you. Swelling and bruising can take several weeks to resolve.  3. It is helpful to take an over-the-counter pain medication regularly for the first few weeks. Choose one of the following that works best for you:  i. Naproxen (Aleve, etc) Two 220mg  tabs twice a day ii. Ibuprofen (Advil, etc) Three 200mg  tabs four times a day (every meal & bedtime) iii. Acetaminophen (Tylenol, etc) 500-650mg  four times a day (every meal & bedtime) 4. A prescription for pain medication (such as oxycodone, hydrocodone, etc) may be given to you upon discharge. Take your pain medication as prescribed.  i. If you are having problems/concerns with the prescription medicine (does not control pain, nausea, vomiting, rash, itching, etc), please call us [phone] to see if we need to switch you to a different pain medicine that will work better for you and/or control your side effect better. ii. If you need a refill on your pain medication, please contact your pharmacy. They will contact our office to request authorization. Prescriptions will not be filled after 5 pm or on week-ends. 1. Avoid getting constipated. When taking pain medications, it is common to experience some constipation. Increasing fluid intake and taking a fiber supplement (such as Metamucil, Citrucel, FiberCon, MiraLax, etc) 1-2 times a day  regularly will usually help prevent this problem from occurring. A mild laxative (prune juice, Milk of Magnesia, MiraLax, etc) should be taken according to package directions if there are no bowel movements after 48 hours.  2. Watch out for diarrhea. If you have many loose bowel movements, simplify your diet to bland foods & liquids for a few days. Stop any stool softeners and decrease your fiber supplement. Switching to mild anti-diarrheal medications (Kayopectate, Pepto Bismol) can help. If this worsens or does not improve, please call us. 3. FOLLOW UP  a. If a follow up appointment is needed one will be scheduled for you. If none is needed with our trauma team, please follow up with your primary care provider within 2-3 weeks from discharge. Please call [facility] at [phone] if you have any questions about follow up.  b. If you have any orthopedic or other injuries you will need to follow up as outlined in your follow up instructions.   WHEN TO CALL us [phone]:  1. Poor pain control 2. Reactions / problems with new medications (rash/itching, nausea, etc)  3. Fever over 101.5 F (38.5 C) 4. Worsening swelling or bruising 5. Worsening pain, productive cough, difficulty breathing or any other concerning symptoms  The clinic staff is available to answer your questions during regular business hours (8:30am-5pm). Please don't hesitate to call and ask to speak to one of our nurses for clinical concerns.  If you have a medical emergency, go to the nearest emergency room or call 911.  A surgeon from [facility] is always on call  at the hospitals   [facility]  [address], [address], [place],  [address] ?  MAIN: [phone] ? TOLL FREE: [phone] ?  FAX [phone]  [email or web address]      Information on Rib Fractures  A rib fracture is a break or crack in one of the bones of the ribs. The ribs are long, curved bones that  wrap around your chest and attach to your spine and your breastbone. The ribs protect your heart, lungs, and other organs in the chest. A broken or cracked rib is often painful but is not usually serious. Most rib fractures heal on their own over time. However, rib fractures can be more serious if multiple ribs are broken or if broken ribs move out of place and push against other structures or organs. What are the causes? This condition is caused by:  Repetitive movements with high force, such as pitching a baseball or having severe coughing spells.  A direct blow to the chest, such as a sports injury, a car accident, or a fall.  Cancer that has spread to the bones, which can weaken bones and cause them to break. What are the signs or symptoms? Symptoms of this condition include:  Pain when you breathe in or cough.  Pain when someone presses on the injured area.  Feeling short of breath. How is this diagnosed? This condition is diagnosed with a physical exam and medical history. Imaging tests may also be done, such as:  Chest X-ray.  CT scan.  MRI.  Bone scan.  Chest ultrasound. How is this treated? Treatment for this condition depends on the severity of the fracture. Most rib fractures usually heal on their own in 1-3 months. Sometimes healing takes longer if there is a cough that does not stop or if there are other activities that make the injury worse (aggravating factors). While you heal, you will be given medicines to control the pain. You will also be taught deep breathing exercises. Severe injuries may require hospitalization or surgery. Follow these instructions at home: Managing pain, stiffness, and swelling  If directed, apply ice to the injured area. ? Put ice in a plastic bag. ? Place a towel between your skin and the bag. ? Leave the ice on for 20 minutes, 2-3 times a day.  Take over-the-counter and prescription medicines only as told by your health care  provider. Activity  Avoid a lot of activity and any activities or movements that cause pain. Be careful during activities and avoid bumping the injured rib.  Slowly increase your activity as told by your health care provider. General instructions  Do deep breathing exercises as told by your health care provider. This helps prevent pneumonia, which is a common complication of a broken rib. Your health care provider may instruct you to: ? Take deep breaths several times a day. ? Try to cough several times a day, holding a pillow against the injured area. ? Use a device called incentive spirometer to practice deep breathing several times a day.  Drink enough fluid to keep your urine pale yellow.  Do not wear a rib belt or binder. These restrict breathing, which can lead to pneumonia.  Keep all follow-up visits as told by your health care provider. This is important. Contact a health care provider if:  You have a fever. Get help right away if:  You have difficulty breathing or you are short of breath.  You develop a cough that does   not stop, or you cough up thick or bloody sputum.  You have nausea, vomiting, or pain in your abdomen.  Your pain gets worse and medicine does not help. Summary  A rib fracture is a break or crack in one of the bones of the ribs.  A broken or cracked rib is often painful but is not usually serious.  Most rib fractures heal on their own over time.  Treatment for this condition depends on the severity of the fracture.  Avoid a lot of activity and any activities or movements that cause pain. This information is not intended to replace advice given to you by your health care provider. Make sure you discuss any questions you have with your health care provider. Document Released: 06/26/2005 Document Revised: 09/25/2016 Document Reviewed: 09/25/2016 Elsevier Interactive Patient Education  2019 [place] or Splint Care, Adult Casts and  splints are supports that are worn to protect broken bones and other injuries. A cast or splint may hold a bone still and in the correct position while it heals. Casts and splints may also help ease pain, swelling, and muscle spasms. A cast is a hardened support that is usually made of fiberglass or plaster. It is custom-fit to the body and it offers more protection than a splint. It cannot be taken off and put back on. A splint is a type of soft support that is usually made from cloth and elastic. It can be adjusted or taken off as needed. You may need a cast or a splint if you:  Have a broken bone.  Have a soft-tissue injury.  Need to keep an injured body part from moving (keep it immobile) after surgery. How is this treated? If you have a cast:   Do not stick anything inside the cast to scratch your skin. Sticking something in the cast increases your risk of infection.  Check the skin around the cast every day. Tell your health care provider about any concerns.  You may put lotion on dry skin around the edges of the cast. Do not put lotion on the skin underneath the cast.  Keep the cast clean.  If the cast is not waterproof: ? Do not let it get wet. ? Cover it with a watertight covering when you take a bath or a shower. If you have a splint:   Wear it as told by your health care provider. Remove it only as told by your health care provider.  Loosen the splint if your fingers or toes tingle, become numb, or turn cold and blue.  Keep the splint clean.  If the splint is not waterproof: ? Do not let it get wet. ? Cover it with a watertight covering when you take a bath or a shower. Bathing  Do not take baths or swim until your health care provider approves. Ask your health care provider if you can take showers. You may only be allowed to take sponge baths for bathing.  If your cast or splint is not waterproof, cover it with a watertight covering when you take a bath or  shower. Managing pain, stiffness, and swelling  Move your fingers or toes often to avoid stiffness and to lessen swelling.  Raise (elevate) the injured area above the level of your heart while sitting or lying down. Safety  Do not use the injured limb to support your body weight until your health care provider says that it is okay.  Use crutches or other assistive  devices as told by your health care provider. General instructions  Do not put pressure on any part of the cast or splint until it is fully hardened. This may take several hours.  Return to your normal activities as told by your health care provider. Ask your health care provider what activities are safe for you.  Take over-the-counter and prescription medicines only as told by your health care provider.  Keep all follow-up visits as told by your health care provider. This is important. Contact a health care provider if:  Your cast or splint gets damaged.  The skin around the cast gets red or raw.  The skin under the cast is extremely itchy or painful.  Your cast or splint feels very uncomfortable.  Your cast or splint is too tight or too loose.  Your cast becomes wet or it develops a soft spot or area.  You get an object stuck under your cast. Get help right away if:  Your pain is getting worse.  The injured area tingles, becomes numb, or turns cold and blue.  The part of your body above or below the cast is swollen and discolored.  You cannot feel or move your fingers or toes.  There is fluid leaking through the cast.  You have severe pain or pressure under the cast.  You have trouble breathing.  You have shortness of breath.  You have chest pain. This information is not intended to replace advice given to you by your health care provider. Make sure you discuss any questions you have with your health care provider. Document Released: 06/23/2000 Document Revised: 04/23/2017 Document Reviewed:  12/18/2015 Elsevier Patient Education  2020 [employer].

## 2019-06-23 NOTE — Progress Notes (Signed)
Patient discharging home. Discharge instructions explained to patient and she verbalized understanding. Packed all her belongings. Meds delivered to room by Pharmacy. Awaiting for her walker. No further questions or concerns voiced.

## 2019-06-23 NOTE — Plan of Care (Signed)
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
## 2019-06-23 NOTE — Plan of Care (Signed)
  Problem: Pain Managment: Goal: General experience of comfort will improve Outcome: Progressing   Problem: Safety: Goal: Ability to remain free from injury will improve Outcome: Progressing   Problem: Skin Integrity: Goal: Risk for impaired skin integrity will decrease Outcome: Progressing   
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
## 2019-06-26 ENCOUNTER — Institutional Professional Consult (permissible substitution): Payer: Federal, State, Local not specified - PPO | Admitting: Plastic Surgery

## 2019-07-01 ENCOUNTER — Telehealth: Payer: Self-pay | Admitting: Surgical

## 2019-07-01 DIAGNOSIS — S62101D Fracture of unspecified carpal bone, right wrist, subsequent encounter for fracture with routine healing: Secondary | ICD-10-CM

## 2019-07-01 NOTE — Telephone Encounter (Signed)
Called patient, did not answer. No VML.  Ordered right wrist complete x-ray

## 2019-07-02 ENCOUNTER — Other Ambulatory Visit: Payer: Self-pay

## 2019-07-02 ENCOUNTER — Ambulatory Visit (HOSPITAL_BASED_OUTPATIENT_CLINIC_OR_DEPARTMENT_OTHER)
Admission: RE | Admit: 2019-07-02 | Discharge: 2019-07-02 | Disposition: A | Discharge status: Home / Self Care | Payer: Federal, State, Local not specified - PPO | Source: Ambulatory Visit | Attending: Surgical | Admitting: Surgical

## 2019-07-02 DIAGNOSIS — S62101D Fracture of unspecified carpal bone, right wrist, subsequent encounter for fracture with routine healing: Secondary | ICD-10-CM

## 2019-07-09 ENCOUNTER — Other Ambulatory Visit: Payer: Self-pay

## 2019-07-09 ENCOUNTER — Ambulatory Visit (INDEPENDENT_AMBULATORY_CARE_PROVIDER_SITE_OTHER): Payer: Federal, State, Local not specified - PPO | Admitting: Plastic Surgery

## 2019-07-09 ENCOUNTER — Encounter: Payer: Self-pay | Admitting: Plastic Surgery

## 2019-07-09 VITALS — BP 131/74 | HR 67 | Temp 97.3°F | Ht 67.0 in | Wt 285.0 lb

## 2019-07-09 DIAGNOSIS — S62101D Fracture of unspecified carpal bone, right wrist, subsequent encounter for fracture with routine healing: Secondary | ICD-10-CM

## 2019-07-09 NOTE — Progress Notes (Signed)
Patient is here 2-1/2 weeks postop from an open reduction internal fixation of the right distal radius fracture.  She is overall doing well and feels like her pain is improving.  She is noticing gradual improvement in the range of motion in her fingers.  She reports a tingling sensation in the thumb and index finger.  On exam everything looks good her wound looks to be healing fine she has good flexion and extension of all of her fingers and her thumb.  There is a mild paresthesia in the thumb and index finger but she says this is getting better with time.  There is normal sensation in the long ring and small fingers.  Postop x-ray from within the last week shows excellent alignment of the fracture and hardware with no signs of any complication.  We will plan to send her for a removable splint and she should be able to start gentle range of motion of the wrist after another week or 2.  I will plan to see your in about 3 weeks with another x-ray.

## 2019-07-16 ENCOUNTER — Encounter: Payer: Federal, State, Local not specified - PPO | Admitting: *Deleted

## 2019-07-16 ENCOUNTER — Other Ambulatory Visit: Payer: Self-pay

## 2019-07-16 ENCOUNTER — Encounter: Payer: Self-pay | Admitting: *Deleted

## 2019-07-16 ENCOUNTER — Ambulatory Visit: Payer: No Typology Code available for payment source | Admitting: *Deleted

## 2019-07-16 ENCOUNTER — Ambulatory Visit: Payer: Federal, State, Local not specified - PPO | Attending: Plastic Surgery | Admitting: *Deleted

## 2019-07-16 DIAGNOSIS — R278 Other lack of coordination: Secondary | ICD-10-CM

## 2019-07-16 DIAGNOSIS — R29898 Other symptoms and signs involving the musculoskeletal system: Secondary | ICD-10-CM | POA: Insufficient documentation

## 2019-07-16 DIAGNOSIS — R601 Generalized edema: Secondary | ICD-10-CM | POA: Diagnosis present

## 2019-07-16 DIAGNOSIS — M79641 Pain in right hand: Secondary | ICD-10-CM | POA: Diagnosis present

## 2019-07-16 DIAGNOSIS — M25531 Pain in right wrist: Secondary | ICD-10-CM | POA: Diagnosis not present

## 2019-07-16 NOTE — Patient Instructions (Addendum)
WEARING SCHEDULE:  Wear splint at ALL times except for hygiene care (May remove splint for exercises and then immediately place back on ONLY if directed by the therapist)  PURPOSE:  To prevent movement and for protection until injury can heal  CARE OF SPLINT:  Keep splint away from heat sources including: stove, radiator or furnace, or a car in sunlight. The splint can melt and will no longer fit you properly  Keep away from pets and children  Clean the splint with rubbing alcohol as needed..  * During this time, make sure you also clean your hand/arm as instructed by your therapist and/or perform dressing changes as needed. Then dry hand/arm completely before replacing splint. (When cleaning hand/arm, keep it immobilized in same position until splint is replaced)  PRECAUTIONS/POTENTIAL PROBLEMS: *If you notice or experience increased pain, swelling, numbness, or a lingering reddened area from the splint: Contact your therapist immediately by calling [phone]. You must wear the splint for protection, but we will get you scheduled for adjustments as quickly as possible.  (If only straps or hooks need to be replaced and NO adjustments to the splint need to be made, just call the office ahead and let them know you are coming in)  If you have any medical concerns or signs of infection, please call your doctor immediately  Flexor Tendon Gliding (Active Full Fist)    With your splint on, Straighten all fingers, then make a fist, bending all joints. Repeat _5-10_ times. Do _4-6__ sessions per day.  Opposition (Active)    With your splint on, Touch tip of thumb to nail tip of each finger in turn, making an "O" shape. Repeat _5___ times. Do 4-6__ sessions per day.           On 07/26/2019 you can begin these exercises: Take the brace off to do these exercises: WRIST: Flexion Against Gravity    Rest arm and hand on surface, palm up. Raise wrist up. 5-10___ reps per set,  __4-6_ sets per day   WRIST: Extension Against Gravity    Rest arm and hand on surface, palm down. Raise wrist up. _5-10 reps per set, _4-6_ sets per day   Pronation (Active)    With elbow held at side, wrist straight and palm facing up, turn until palm faces down completely. Hold __5__ seconds. Repeat __5-10__ times. Do _4-6__ sessions per day. Activity: Use this motion to turn pages of a book.*  Supination (Active)    With elbow held at right angle and kept at side, turn palm upward as far as possible. Hold _5__ seconds. Repeat _5-10_ times. Do __4-6__ sessions per day. Activity: Use this motion when turning cards over.*  Radial Deviation (Active With Finger Flexion)    With fingers curled, bend wrist toward thumb side. Repeat __5__ times. Do __4-6__ sessions per day.  Ulnar Deviation (Active)    With fingers curled, bend hand to side at wrist in direction of little finger. Repeat __5-10__ times. Do __4-6__ sessions per day.  Copyright  VHI. All rights reserved.
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
## 2019-07-29 ENCOUNTER — Telehealth: Payer: Self-pay | Admitting: Plastic Surgery

## 2019-07-29 NOTE — Telephone Encounter (Signed)
Ibuprofen 2x a day will be fine. If she is in pain, she should take the ibuprofen, or tylenol. Whichever works best for her. If she does not have pain, she should not take any ibuprofen or tylenol. I tried to call patient this afternoon at 4:06pm but she did not answer.
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
## 2019-07-29 NOTE — Telephone Encounter (Signed)
Called and spoke with the patient and informed her of the message below from [facility].  Patient verbalized understanding and agreed.//AB/CMA

## 2019-07-30 ENCOUNTER — Telehealth: Payer: Self-pay

## 2019-07-30 NOTE — Telephone Encounter (Signed)
Patient can remove tape. Keep wrist splint in place per OT orders. Call with any questions or concerns.

## 2019-07-30 NOTE — Telephone Encounter (Signed)
Called and informed the patient of the responses from [facility] regarding the patient's message below.  Patient verbalized understanding and agreed.//AB/CMA

## 2019-07-30 NOTE — Telephone Encounter (Signed)
Patient had follow up with Dr. Arita Miss tomorrow, but is rescheduling due to fever, chills, sore throat, and body aches. She will be tested for Covid-19 tomorrow.   She still has surgical tape on her right wrist from surgery on 06/21/2019. She states the tape has dried blood on it as well as an odor. She would like to know if she is able to remove this herself or if she should leave it on.

## 2019-07-31 ENCOUNTER — Ambulatory Visit: Payer: Federal, State, Local not specified - PPO | Admitting: Plastic Surgery

## 2019-08-14 ENCOUNTER — Ambulatory Visit: Payer: Federal, State, Local not specified - PPO | Admitting: Plastic Surgery

## 2019-08-20 ENCOUNTER — Other Ambulatory Visit: Payer: Self-pay

## 2019-08-20 ENCOUNTER — Ambulatory Visit (HOSPITAL_BASED_OUTPATIENT_CLINIC_OR_DEPARTMENT_OTHER)
Admission: RE | Admit: 2019-08-20 | Discharge: 2019-08-20 | Disposition: A | Discharge status: Home / Self Care | Payer: Federal, State, Local not specified - PPO | Source: Ambulatory Visit | Attending: Plastic Surgery | Admitting: Plastic Surgery

## 2019-08-20 DIAGNOSIS — S62101D Fracture of unspecified carpal bone, right wrist, subsequent encounter for fracture with routine healing: Secondary | ICD-10-CM | POA: Diagnosis not present

## 2019-08-21 ENCOUNTER — Encounter: Payer: Self-pay | Admitting: Plastic Surgery

## 2019-08-21 ENCOUNTER — Ambulatory Visit (INDEPENDENT_AMBULATORY_CARE_PROVIDER_SITE_OTHER): Payer: Federal, State, Local not specified - PPO | Admitting: Plastic Surgery

## 2019-08-21 VITALS — BP 131/56 | HR 68 | Temp 97.1°F | Ht 67.0 in | Wt 279.0 lb

## 2019-08-21 DIAGNOSIS — S62101D Fracture of unspecified carpal bone, right wrist, subsequent encounter for fracture with routine healing: Secondary | ICD-10-CM

## 2019-08-21 NOTE — Progress Notes (Signed)
Patient is here postop from an open reduction internal fixation of right distal radius.  She feels like she is doing well.  She still has some stiffness in the wrist.  Therapy has been a long distance for her so she has been doing most of it at home.  She also reports some slight decreased sensation in the thumb and index finger.  Otherwise she has good finger range of motion.  On exam her scar is healed great.  She has almost complete range of motion of all fingers and thumb.  She has about 15 degrees of wrist flexion and extension.  Pronation and supination seem quite good.  Recent x-ray was reviewed showing excellent position of the hardware and alignment of the fracture segments.  I have asked her to continue with therapy and I will plan to see her again in 2 to 3 months.  I suspect that the mild decrease in sensation will be transient and will be better by the next time I see her.

## 2019-11-06 ENCOUNTER — Other Ambulatory Visit: Payer: Self-pay

## 2019-11-06 ENCOUNTER — Ambulatory Visit (INDEPENDENT_AMBULATORY_CARE_PROVIDER_SITE_OTHER): Payer: Federal, State, Local not specified - PPO | Admitting: Plastic Surgery

## 2019-11-06 ENCOUNTER — Encounter: Payer: Self-pay | Admitting: Plastic Surgery

## 2019-11-06 VITALS — BP 128/83 | HR 73 | Temp 97.3°F | Ht 67.0 in | Wt 274.8 lb

## 2019-11-06 DIAGNOSIS — S62101D Fracture of unspecified carpal bone, right wrist, subsequent encounter for fracture with routine healing: Secondary | ICD-10-CM | POA: Diagnosis not present

## 2019-11-06 NOTE — Progress Notes (Signed)
   Referring Provider Gregary Signs, MD [address] [place],  [place] [address]   CC:  Chief Complaint  Patient presents with  . Follow-up    ORIF distal radius FX      Priscilla Mcfarland is an 64 y.o. female.  HPI: Patient is here about 4 months out from a open duction internal fixation of the right distal radius fracture.  She overall feels like she is doing well.  She initially had some numbness and tingling in the median distribution and this is gone at this point and now she has normal sensation.  She reports essentially normal function and normal range of motion.  Review of Systems General: Denies fevers or chills  Physical Exam Vitals with BMI 11/06/2019 08/21/2019 07/09/2019  Height 5\' 7"  5\' 7"  5\' 7"   Weight 274 lbs 13 oz 279 lbs 285 lbs  BMI 43.03 43.69 44.63  Systolic 128 131  Diastolic 83 56 74  Pulse 73 68 67    General:  No acute distress,  Alert and oriented, Non-Toxic, Normal speech and affect Right hand: Fingers well-perfused with normal cap refill palp radial pulse.  Sensation intact throughout.  She has full range of motion of her fingers and essentially normal range of motion of her wrist in flexion extension and pronation supination.  Her scar is maturing.  There are a few small areas of firmness that appear to be buried suture knots to me.  Otherwise there is no apparent issues.  Assessment/Plan Patient is doing well after open reduction internal fixation of distal radius fracture.  She is back to pretty much normal function and normal range of motion.  We discussed scar massage going forward.  We will plan to see her again as needed.  11/06/2019, 11:21 AM
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
